# Patient Record
Sex: Female | Born: 1995 | Race: White | Hispanic: No | Marital: Single
Health system: Southern US, Community
[De-identification: ages and names within clinical notes are randomized; demographics above are authoritative.]

## PROBLEM LIST (undated history)

## (undated) DIAGNOSIS — F341 Dysthymic disorder: Secondary | ICD-10-CM

## (undated) DIAGNOSIS — R197 Diarrhea, unspecified: Secondary | ICD-10-CM

## (undated) DIAGNOSIS — F329 Major depressive disorder, single episode, unspecified: Secondary | ICD-10-CM

## (undated) DIAGNOSIS — R109 Unspecified abdominal pain: Secondary | ICD-10-CM

## (undated) DIAGNOSIS — K589 Irritable bowel syndrome without diarrhea: Secondary | ICD-10-CM

## (undated) DIAGNOSIS — F32A Depression, unspecified: Secondary | ICD-10-CM

## (undated) HISTORY — DX: Unspecified abdominal pain: R10.9

## (undated) HISTORY — PX: TYMPANOSTOMY TUBE PLACEMENT: SHX32

## (undated) HISTORY — DX: Major depressive disorder, single episode, unspecified: F32.9

## (undated) HISTORY — DX: Diarrhea, unspecified: R19.7

## (undated) HISTORY — DX: Dysthymic disorder: F34.1

## (undated) HISTORY — DX: Irritable bowel syndrome, unspecified: K58.9

## (undated) HISTORY — DX: Depression, unspecified: F32.A

---
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
## 2017-03-11 ENCOUNTER — Encounter: Payer: Self-pay | Admitting: Emergency Medicine

## 2017-03-11 ENCOUNTER — Emergency Department: Payer: 59

## 2017-03-11 ENCOUNTER — Emergency Department
Admission: EM | Admit: 2017-03-11 | Discharge: 2017-03-11 | Disposition: A | Discharge status: Home / Self Care | Payer: 59 | Attending: Emergency Medicine | Admitting: Emergency Medicine

## 2017-03-11 DIAGNOSIS — F329 Major depressive disorder, single episode, unspecified: Secondary | ICD-10-CM | POA: Insufficient documentation

## 2017-03-11 DIAGNOSIS — R1031 Right lower quadrant pain: Secondary | ICD-10-CM | POA: Insufficient documentation

## 2017-03-11 DIAGNOSIS — Z7722 Contact with and (suspected) exposure to environmental tobacco smoke (acute) (chronic): Secondary | ICD-10-CM | POA: Insufficient documentation

## 2017-03-11 DIAGNOSIS — R109 Unspecified abdominal pain: Secondary | ICD-10-CM

## 2017-03-11 DIAGNOSIS — Z79899 Other long term (current) drug therapy: Secondary | ICD-10-CM | POA: Insufficient documentation

## 2017-03-11 DIAGNOSIS — R1013 Epigastric pain: Secondary | ICD-10-CM | POA: Diagnosis present

## 2017-03-11 LAB — COMPREHENSIVE METABOLIC PANEL
ALBUMIN: 4.3 g/dL (ref 3.5–5.0)
ALT: 27 U/L (ref 14–54)
ANION GAP: 7 (ref 5–15)
AST: 31 U/L (ref 15–41)
Alkaline Phosphatase: 41 U/L (ref 38–126)
BUN: 11 mg/dL (ref 6–20)
CO2: 25 mmol/L (ref 22–32)
Calcium: 9.7 mg/dL (ref 8.9–10.3)
Chloride: 107 mmol/L (ref 101–111)
Creatinine, Ser: 0.59 mg/dL (ref 0.44–1.00)
GFR calc Af Amer: >60 mL/min (ref >60)
GFR calc non Af Amer: >60 mL/min (ref >60)
Glucose, Bld: 94 mg/dL (ref 65–99)
POTASSIUM: 3.5 mmol/L (ref 3.5–5.1)
SODIUM: 139 mmol/L (ref 135–145)
TOTAL PROTEIN: 7.5 g/dL (ref 6.5–8.1)
Total Bilirubin: 0.8 mg/dL (ref 0.3–1.2)

## 2017-03-11 LAB — URINALYSIS, COMPLETE (UACMP) WITH MICROSCOPIC
BILIRUBIN URINE: NEGATIVE
Bacteria, UA: NONE SEEN
GLUCOSE, UA: NEGATIVE mg/dL
HGB URINE DIPSTICK: NEGATIVE
KETONES UR: 20 mg/dL — AB
NITRITE: NEGATIVE
PROTEIN: NEGATIVE mg/dL
Specific Gravity, Urine: 1.019 (ref 1.005–1.030)
pH: 6 (ref 5.0–8.0)

## 2017-03-11 LAB — CBC
HEMATOCRIT: 43.2 % (ref 35.0–47.0)
HEMOGLOBIN: 14.7 g/dL (ref 12.0–16.0)
MCH: 32.5 pg (ref 26.0–34.0)
MCHC: 34.1 g/dL (ref 32.0–36.0)
MCV: 95.2 fL (ref 80.0–100.0)
Platelets: 304 10*3/uL (ref 150–440)
RBC: 4.54 MIL/uL (ref 3.80–5.20)
RDW: 11.5 % (ref 11.5–14.5)
WBC: 7.8 10*3/uL (ref 3.6–11.0)

## 2017-03-11 LAB — POCT PREGNANCY, URINE: Preg Test, Ur: NEGATIVE

## 2017-03-11 LAB — HCG, QUANTITATIVE, PREGNANCY: hCG, Beta Chain, Quant, S: <1 m[IU]/mL (ref <5)

## 2017-03-11 LAB — LIPASE, BLOOD: LIPASE: 28 U/L (ref 11–51)

## 2017-03-11 MED ORDER — IBUPROFEN 600 MG PO TABS: 600.0000 mg | ORAL_TABLET | Freq: Three times a day (TID) | ORAL | 0 refills | Status: DC | PRN

## 2017-03-11 MED ORDER — ONDANSETRON HCL 4 MG PO TABS: 4.0000 mg | ORAL_TABLET | Freq: Every day | ORAL | 0 refills | Status: DC | PRN

## 2017-03-11 MED ORDER — IOPAMIDOL (ISOVUE-300) INJECTION 61%
75.0000 mL | Freq: Once | INTRAVENOUS | Status: AC | PRN
  Administered 2017-03-11: 75 mL via INTRAVENOUS
  Filled 2017-03-11: qty 75

## 2017-03-11 NOTE — ED Provider Notes (Signed)
[facility] Emergency Department Provider Note  ____________________________________________   First MD Initiated Contact with Patient 03/11/17 1302     (approximate)  I have reviewed the triage vital signs and the nursing notes.   HISTORY  Chief Complaint Abdominal Pain   HPI Peggy Olson is a 22 y.o. female who self presents the emergency department with diffuse moderate severity cramping epigastric pain that awoke her from sleep this morning around 5 in the morning.  She went back to sleep several times however it woke her from her sleep multiple times.  The pain is now migrated towards her right lower quadrant.  Her last menstrual period was 1 week ago.  She has no history of abdominal surgeries.  She denies nausea or vomiting.  She denies dysuria frequency or hesitancy.  She denies vaginal discharge.  Her symptoms have been constant.  Seem to be somewhat worse with movement somewhat improved with rest.  Past Medical History:  Diagnosis Date  . Abdominal pain   . Depression    no medications required  . Diarrhea   . Dysthymia   . IBS (irritable bowel syndrome)     Patient Active Problem List   Diagnosis Date Noted  . Menorrhagia with regular cycle 09/25/2016  . Dysmenorrhea 09/25/2016  . Dysthymia 07/13/2015  . Social anxiety disorder 07/13/2015  . Involuntary commitment 07/13/2015  . Suicidal ideation 07/13/2015  . Small intestinal bacterial overgrowth 07/20/2013  . Nausea alone 05/21/2013  . RUQ abdominal pain 05/21/2013  . Excessive gas 05/21/2013  . Waterbrash 05/21/2013  . Lower abdominal pain   . Diarrhea     Past Surgical History:  Procedure Laterality Date  . TYMPANOSTOMY TUBE PLACEMENT      Prior to Admission medications   Medication Sig Start Date End Date Taking? Authorizing Provider  ibuprofen (ADVIL,MOTRIN) 600 MG tablet Take 1 tablet (600 mg total) by mouth every 8 (eight) hours as needed. 03/11/17   Merrily Brittle, MD    Norethindrone Acetate-Ethinyl Estrad-FE (MICROGESTIN 24 FE) 1-20 MG-MCG(24) tablet Take 1 tablet by mouth daily. 09/25/16   Farrel Conners, CNM  ondansetron (ZOFRAN) 4 MG tablet Take 1 tablet (4 mg total) by mouth daily as needed. 03/11/17 03/11/18  Merrily Brittle, MD    Allergies Romilda Joy Donia Pounds fruticosus] and Kiwi extract  Family History  Problem Relation Age of Onset  . Lactose intolerance Mother   . Heart disease Maternal Grandfather   . Colon cancer Paternal Grandmother   . Irritable bowel syndrome Neg Hx   . Celiac disease Neg Hx   . Inflammatory bowel disease Neg Hx   . Cholelithiasis Neg Hx     Social History Social History   Tobacco Use  . Smoking status: Passive Smoke Exposure - Never Smoker  . Smokeless tobacco: Never Used  Substance Use Topics  . Alcohol use: No  . Drug use: No    Review of Systems Constitutional: No fever/chills Eyes: No visual changes. ENT: No sore throat. Cardiovascular: Denies chest pain. Respiratory: Denies shortness of breath. Gastrointestinal: Positive for abdominal pain.  No nausea, no vomiting.  No diarrhea.  No constipation. Genitourinary: Negative for dysuria. Musculoskeletal: Negative for back pain. Skin: Negative for rash. Neurological: Negative for headaches, focal weakness or numbness.   ____________________________________________   PHYSICAL EXAM:  VITAL SIGNS: ED Triage Vitals [03/11/17 1210]  Enc Vitals Group     BP 123/75     Pulse Rate 90     Resp 18  Temp 98.3 F (36.8 C)     Temp Source Oral     SpO2 100 %     Weight 90 lb (40.8 kg)     Height 5\' 1"  (1.549 m)     Head Circumference      Peak Flow      Pain Score 6     Pain Loc      Pain Edu?      Excl. in GC?     Constitutional: Alert and oriented x4 appears somewhat uncomfortable nontoxic no diaphoresis speaks in full clear sentences Eyes: PERRL EOMI. Head: Atraumatic. Nose: No congestion/rhinnorhea. Mouth/Throat: No trismus Neck: No  stridor.   Cardiovascular: Normal rate, regular rhythm. Grossly normal heart sounds.  Good peripheral circulation. Respiratory: Normal respiratory effort.  No retractions. Lungs CTAB and moving good air Gastrointestinal: Soft abdomen quite tender over McBurney's point also positive Rovsing's she has some rebound and guarding although no frank peritonitis Musculoskeletal: No lower extremity edema   Neurologic:  Normal speech and language. No gross focal neurologic deficits are appreciated. Skin:  Skin is warm, dry and intact. No rash noted. Psychiatric: Mood and affect are normal. Speech and behavior are normal.    ____________________________________________   DIFFERENTIAL includes but not limited to  Appendicitis, ovarian torsion, ovarian cyst, pelvic inflammatory disease, urinary tract infection, pyelonephritis ____________________________________________   LABS (all labs ordered are listed, but only abnormal results are displayed)  Labs Reviewed  URINALYSIS, COMPLETE (UACMP) WITH MICROSCOPIC - Abnormal; Notable for the following components:      Result Value   Color, Urine YELLOW (*)    APPearance CLOUDY (*)    Ketones, ur 20 (*)    Leukocytes, UA LARGE (*)    Squamous Epithelial / LPF 6-30 (*)    All other components within normal limits  LIPASE, BLOOD  COMPREHENSIVE METABOLIC PANEL  CBC  HCG, QUANTITATIVE, PREGNANCY  POC URINE PREG, ED  POCT PREGNANCY, URINE    Lab work reviewed by me not pregnant no acute disease __________________________________________  EKG   ____________________________________________  RADIOLOGY  CT abdomen pelvis reviewed by me with a small amount of free fluid and no obvious appendicitis although appendix not visualized ____________________________________________   PROCEDURES  Procedure(s) performed: no  Procedures  Critical Care performed: no  Observation: no ____________________________________________   INITIAL  IMPRESSION / ASSESSMENT AND PLAN / ED COURSE  Pertinent labs & imaging results that were available during my care of the patient were reviewed by me and considered in my medical decision making (see chart for details).  Patient arrives uncomfortable appearing with diffuse abdominal discomfort but quite tender over McBurney's.  CT abdomen pelvis is pending.     ----------------------------------------- 2:16 PM on 03/11/2017 -----------------------------------------  The patient's pain is resolved and she is able to eat and drink.  CT scan with no obvious etiology of her symptoms however a normal appendix is not visualized.  I discussed with the patient the diagnostic uncertainty and I offered her surgical consultation now versus outpatient management with a recheck tomorrow morning in our emergency department and she would prefer to go home which I think is reasonable. ____________________________________________   FINAL CLINICAL IMPRESSION(S) / ED DIAGNOSES  Final diagnoses:  Abdominal pain, unspecified abdominal location      NEW MEDICATIONS STARTED DURING THIS VISIT:  New Prescriptions   IBUPROFEN (ADVIL,MOTRIN) 600 MG TABLET    Take 1 tablet (600 mg total) by mouth every 8 (eight) hours as needed.   ONDANSETRON (ZOFRAN) 4  MG TABLET    Take 1 tablet (4 mg total) by mouth daily as needed.     Note:  This document was prepared using Dragon voice recognition software and may include unintentional dictation errors.     Merrily Brittle, MD 03/12/17 1236

## 2017-03-11 NOTE — ED Triage Notes (Addendum)
abd pain today , denies N/V/D, denies vaginal or urinary issues

## 2017-03-11 NOTE — ED Notes (Signed)
Patient to room 36 asked to put gown on.

## 2017-03-11 NOTE — ED Notes (Signed)
T-98.3 PO, P-80 radial, R-16, BP 122/91 left arm automatic.  Patient states she is experiencing abdominal pain from sternum to pelvis unchanged since triage.  Alert and oriented.  Color good. NAD.  [facility]

## 2017-03-11 NOTE — Discharge Instructions (Signed)
Today your blood work was reassuring and your CT scan did not show a clear appendicitis, however it is still entirely possible that you have early appendicitis that we were unable to see.  Please return to our emergency department tomorrow to have a doctor feel your stomach and reevaluate you.  Return to the ER sooner for any new or worsening symptoms such as fevers, chills, worsening pain, if you cannot eat or drink, or for any other issues whatsoever.  It was a pleasure to take care of you today, and thank you for coming to our emergency department.  If you have any questions or concerns before leaving please ask the nurse to grab me and I'm more than happy to go through your aftercare instructions again.  If you were prescribed any opioid pain medication today such as Norco, Vicodin, Percocet, morphine, hydrocodone, or oxycodone please make sure you do not drive when you are taking this medication as it can alter your ability to drive safely.  If you have any concerns once you are home that you are not improving or are in fact getting worse before you can make it to your follow-up appointment, please do not hesitate to call 911 and come back for further evaluation.  Merrily BrittleNeil Caden Fukushima, MD  Results for orders placed or performed during the hospital encounter of 03/11/17  Lipase, blood  Result Value Ref Range   Lipase 28 11 - 51 U/L  Comprehensive metabolic panel  Result Value Ref Range   Sodium 139 135 - 145 mmol/L   Potassium 3.5 3.5 - 5.1 mmol/L   Chloride 107 101 - 111 mmol/L   CO2 25 22 - 32 mmol/L   Glucose, Bld 94 65 - 99 mg/dL   BUN 11 6 - 20 mg/dL   Creatinine, Ser 1.610.59 0.44 - 1.00 mg/dL   Calcium 9.7 8.9 - 09.610.3 mg/dL   Total Protein 7.5 6.5 - 8.1 g/dL   Albumin 4.3 3.5 - 5.0 g/dL   AST 31 15 - 41 U/L   ALT 27 14 - 54 U/L   Alkaline Phosphatase 41 38 - 126 U/L   Total Bilirubin 0.8 0.3 - 1.2 mg/dL   GFR calc non Af Amer >60 >60 mL/min   GFR calc Af Amer >60 >60 mL/min   Anion  gap 7 5 - 15  CBC  Result Value Ref Range   WBC 7.8 3.6 - 11.0 K/uL   RBC 4.54 3.80 - 5.20 MIL/uL   Hemoglobin 14.7 12.0 - 16.0 g/dL   HCT 04.543.2 40.935.0 - 81.147.0 %   MCV 95.2 80.0 - 100.0 fL   MCH 32.5 26.0 - 34.0 pg   MCHC 34.1 32.0 - 36.0 g/dL   RDW 91.411.5 78.211.5 - 95.614.5 %   Platelets 304 150 - 440 K/uL  Urinalysis, Complete w Microscopic  Result Value Ref Range   Color, Urine YELLOW (A) YELLOW   APPearance CLOUDY (A) CLEAR   Specific Gravity, Urine 1.019 1.005 - 1.030   pH 6.0 5.0 - 8.0   Glucose, UA NEGATIVE NEGATIVE mg/dL   Hgb urine dipstick NEGATIVE NEGATIVE   Bilirubin Urine NEGATIVE NEGATIVE   Ketones, ur 20 (A) NEGATIVE mg/dL   Protein, ur NEGATIVE NEGATIVE mg/dL   Nitrite NEGATIVE NEGATIVE   Leukocytes, UA LARGE (A) NEGATIVE   RBC / HPF 6-30 0 - 5 RBC/hpf   WBC, UA TOO NUMEROUS TO COUNT 0 - 5 WBC/hpf   Bacteria, UA NONE SEEN NONE SEEN   Squamous Epithelial /  LPF 6-30 (A) NONE SEEN   Mucus PRESENT    Amorphous Crystal PRESENT   hCG, quantitative, pregnancy  Result Value Ref Range   hCG, Beta Chain, Quant, S <1 <5 mIU/mL  Pregnancy, urine POC  Result Value Ref Range   Preg Test, Ur NEGATIVE NEGATIVE   Ct Abdomen Pelvis W Contrast  Result Date: 03/11/2017 CLINICAL DATA:  Epigastric and umbilical pain for 9 hours, abdominal infection, normal white count and lipase, history irritable bowel syndrome EXAM: CT ABDOMEN AND PELVIS WITH CONTRAST TECHNIQUE: Multidetector CT imaging of the abdomen and pelvis was performed using the standard protocol following bolus administration of intravenous contrast. Sagittal and coronal MPR images reconstructed from axial data set. CONTRAST:  75mL ISOVUE-300 IOPAMIDOL (ISOVUE-300) INJECTION 61% IV. No oral contrast administered. COMPARISON:  None FINDINGS: Lower chest: Lung bases clear Hepatobiliary: Gallbladder and liver normal appearance Pancreas: Normal appearance Spleen: Normal appearance Adrenals/Urinary Tract: Adrenal glands, kidneys, ureters,  and bladder normal appearance Stomach/Bowel: Appendix not definitely visualized. No definite thickened appendix identified adjacent to the cecal tip. Stomach and bowel loops otherwise normal appearance. Vascular/Lymphatic: Unremarkable Reproductive: Uterus and ovaries grossly normal appearance Other: Small amount of nonspecific low-attenuation free pelvic fluid. Minimal stranding of small bowel mesentery in the pelvis, nonspecific. No free air or abscess. No hernia. Musculoskeletal: Unremarkable IMPRESSION: Small amount of nonspecific low-attenuation free pelvic fluid. Nonvisualization of the appendix limiting assessment for acute appendicitis though no definite acute pericecal process is identified. Remainder of exam unremarkable. Electronically Signed   By: Ulyses Southward M.D.   On: 03/11/2017 13:49

## 2017-03-12 ENCOUNTER — Emergency Department: Payer: 59

## 2017-03-12 ENCOUNTER — Encounter: Payer: Self-pay | Admitting: Emergency Medicine

## 2017-03-12 ENCOUNTER — Emergency Department
Admission: EM | Admit: 2017-03-12 | Discharge: 2017-03-12 | Disposition: A | Discharge status: Home / Self Care | Payer: 59 | Attending: Student in an Organized Health Care Education/Training Program | Admitting: Student in an Organized Health Care Education/Training Program

## 2017-03-12 DIAGNOSIS — Z7722 Contact with and (suspected) exposure to environmental tobacco smoke (acute) (chronic): Secondary | ICD-10-CM | POA: Diagnosis not present

## 2017-03-12 DIAGNOSIS — R1030 Lower abdominal pain, unspecified: Secondary | ICD-10-CM | POA: Insufficient documentation

## 2017-03-12 DIAGNOSIS — N760 Acute vaginitis: Secondary | ICD-10-CM | POA: Insufficient documentation

## 2017-03-12 DIAGNOSIS — R1084 Generalized abdominal pain: Secondary | ICD-10-CM | POA: Diagnosis present

## 2017-03-12 DIAGNOSIS — Z79899 Other long term (current) drug therapy: Secondary | ICD-10-CM | POA: Diagnosis not present

## 2017-03-12 DIAGNOSIS — B9689 Other specified bacterial agents as the cause of diseases classified elsewhere: Secondary | ICD-10-CM

## 2017-03-12 DIAGNOSIS — R102 Pelvic and perineal pain: Secondary | ICD-10-CM

## 2017-03-12 LAB — WET PREP, GENITAL
SPERM: NONE SEEN
Trich, Wet Prep: NONE SEEN
YEAST WET PREP: NONE SEEN

## 2017-03-12 LAB — URINALYSIS, COMPLETE (UACMP) WITH MICROSCOPIC
BACTERIA UA: NONE SEEN
Bilirubin Urine: NEGATIVE
Glucose, UA: NEGATIVE mg/dL
HGB URINE DIPSTICK: NEGATIVE
Ketones, ur: 20 mg/dL — AB
NITRITE: NEGATIVE
PROTEIN: NEGATIVE mg/dL
Specific Gravity, Urine: 1.026 (ref 1.005–1.030)
pH: 5 (ref 5.0–8.0)

## 2017-03-12 LAB — CBC
HCT: 42.5 % (ref 35.0–47.0)
HEMOGLOBIN: 14.3 g/dL (ref 12.0–16.0)
MCH: 32 pg (ref 26.0–34.0)
MCHC: 33.6 g/dL (ref 32.0–36.0)
MCV: 95.1 fL (ref 80.0–100.0)
Platelets: 268 10*3/uL (ref 150–440)
RBC: 4.47 MIL/uL (ref 3.80–5.20)
RDW: 11.8 % (ref 11.5–14.5)
WBC: 6.7 10*3/uL (ref 3.6–11.0)

## 2017-03-12 LAB — CHLAMYDIA/NGC RT PCR (ARMC ONLY)
CHLAMYDIA TR: NOT DETECTED
N gonorrhoeae: NOT DETECTED

## 2017-03-12 LAB — COMPREHENSIVE METABOLIC PANEL
ALBUMIN: 4.5 g/dL (ref 3.5–5.0)
ALT: 32 U/L (ref 14–54)
ANION GAP: 10 (ref 5–15)
AST: 29 U/L (ref 15–41)
Alkaline Phosphatase: 39 U/L (ref 38–126)
BUN: 13 mg/dL (ref 6–20)
CHLORIDE: 105 mmol/L (ref 101–111)
CO2: 23 mmol/L (ref 22–32)
Calcium: 9.1 mg/dL (ref 8.9–10.3)
Creatinine, Ser: 0.57 mg/dL (ref 0.44–1.00)
GFR calc Af Amer: >60 mL/min (ref >60)
GFR calc non Af Amer: >60 mL/min (ref >60)
GLUCOSE: 86 mg/dL (ref 65–99)
POTASSIUM: 3.9 mmol/L (ref 3.5–5.1)
SODIUM: 138 mmol/L (ref 135–145)
TOTAL PROTEIN: 7.2 g/dL (ref 6.5–8.1)
Total Bilirubin: 0.8 mg/dL (ref 0.3–1.2)

## 2017-03-12 LAB — LIPASE, BLOOD: LIPASE: 26 U/L (ref 11–51)

## 2017-03-12 MED ORDER — ONDANSETRON HCL 4 MG PO TABS: 4.0000 mg | ORAL_TABLET | Freq: Every day | ORAL | 0 refills | Status: DC | PRN

## 2017-03-12 MED ORDER — CEFTRIAXONE SODIUM 250 MG IJ SOLR
INTRAMUSCULAR | Status: AC
  Filled 2017-03-12: qty 250

## 2017-03-12 MED ORDER — AZITHROMYCIN 500 MG PO TABS
1000.0000 mg | ORAL_TABLET | Freq: Once | ORAL | Status: AC
  Administered 2017-03-12: 1000 mg via ORAL

## 2017-03-12 MED ORDER — AZITHROMYCIN 500 MG PO TABS
ORAL_TABLET | ORAL | Status: AC
  Filled 2017-03-12: qty 2

## 2017-03-12 MED ORDER — METRONIDAZOLE 500 MG PO TABS: 500.0000 mg | ORAL_TABLET | Freq: Two times a day (BID) | ORAL | 0 refills | Status: AC

## 2017-03-12 MED ORDER — CEFTRIAXONE SODIUM 250 MG IJ SOLR
250.0000 mg | Freq: Once | INTRAMUSCULAR | Status: AC
  Administered 2017-03-12: 250 mg via INTRAMUSCULAR

## 2017-03-12 NOTE — ED Triage Notes (Signed)
Lower abdominal pain, was seen here yesterday for the same, here with continued pain, constipation and unable to eat.

## 2017-03-12 NOTE — ED Notes (Signed)
Water provided for pt to fill bladder for [place]

## 2017-03-12 NOTE — ED Provider Notes (Signed)
[facility] Emergency Department Provider Note    None    (approximate)  I have reviewed the triage vital signs and the nursing notes.   HISTORY  Chief Complaint Abdominal Pain    HPI Peggy Olson is a 22 y.o. female presents 24hours after being evaluated in the ER for abdominal pain was initially generalized.  She had extensive workup including blood work and CT imaging was inconclusive.  Advised to return to the ER if pain persist.  States the pain has improved but she still having some lower abdominal pain.  No fevers.  She is tolerating oral hydration but does not have much of an appetite.  Has not moved her bowels.  States she does have some vaginal discharge but no bleeding.  Denies any chance of being pregnant.  States the pain is mild to moderate and crampy in nature.  Past Medical History:  Diagnosis Date  . Abdominal pain   . Depression    no medications required  . Diarrhea   . Dysthymia   . IBS (irritable bowel syndrome)    Family History  Problem Relation Age of Onset  . Lactose intolerance Mother   . Heart disease Maternal Grandfather   . Colon cancer Paternal Grandmother   . Irritable bowel syndrome Neg Hx   . Celiac disease Neg Hx   . Inflammatory bowel disease Neg Hx   . Cholelithiasis Neg Hx    Past Surgical History:  Procedure Laterality Date  . TYMPANOSTOMY TUBE PLACEMENT     Patient Active Problem List   Diagnosis Date Noted  . Menorrhagia with regular cycle 09/25/2016  . Dysmenorrhea 09/25/2016  . Dysthymia 07/13/2015  . Social anxiety disorder 07/13/2015  . Involuntary commitment 07/13/2015  . Suicidal ideation 07/13/2015  . Small intestinal bacterial overgrowth 07/20/2013  . Nausea alone 05/21/2013  . RUQ abdominal pain 05/21/2013  . Excessive gas 05/21/2013  . Waterbrash 05/21/2013  . Lower abdominal pain   . Diarrhea       Prior to Admission medications   Medication Sig Start Date End Date Taking?  Authorizing Provider  ibuprofen (ADVIL,MOTRIN) 600 MG tablet Take 1 tablet (600 mg total) by mouth every 8 (eight) hours as needed. 03/11/17   Merrily Brittleifenbark, Neil, MD  Norethindrone Acetate-Ethinyl Estrad-FE (MICROGESTIN 24 FE) 1-20 MG-MCG(24) tablet Take 1 tablet by mouth daily. 09/25/16   Farrel ConnersGutierrez, Colleen, CNM  ondansetron (ZOFRAN) 4 MG tablet Take 1 tablet (4 mg total) by mouth daily as needed. 03/11/17 03/11/18  Merrily Brittleifenbark, Neil, MD    Allergies Romilda JoyBlackberry Donia Pounds[rubus fruticosus] and Kiwi extract    Social History Social History   Tobacco Use  . Smoking status: Passive Smoke Exposure - Never Smoker  . Smokeless tobacco: Never Used  Substance Use Topics  . Alcohol use: No  . Drug use: No    Review of Systems Patient denies headaches, rhinorrhea, blurry vision, numbness, shortness of breath, chest pain, edema, cough, abdominal pain, nausea, vomiting, diarrhea, dysuria, fevers, rashes or hallucinations unless otherwise stated above in HPI. ____________________________________________   PHYSICAL EXAM:  VITAL SIGNS: Vitals:   03/12/17 1058  BP: 124/75  Pulse: 86  Resp: 18  Temp: (!) 97.5 F (36.4 C)  SpO2: 100%    Constitutional: Alert and oriented. Well appearing and in no acute distress. Eyes: Conjunctivae are normal.  Head: Atraumatic. Nose: No congestion/rhinnorhea. Mouth/Throat: Mucous membranes are moist.   Neck: No stridor. Painless ROM.  Cardiovascular: Normal rate, regular rhythm. Grossly normal heart sounds.  Good peripheral circulation. Respiratory: Normal respiratory effort.  No retractions. Lungs CTAB. Gastrointestinal: Soft and nontender. No distention. No abdominal bruits. No CVA tenderness. Genitourinary: Cervix is tender with some purulent discharge.  No adnexal tenderness.  Does have some erythema at the cervical loss. Musculoskeletal: No lower extremity tenderness nor edema.  No joint effusions. Neurologic:  Normal speech and language. No gross focal neurologic  deficits are appreciated. No facial droop Skin:  Skin is warm, dry and intact. No rash noted. Psychiatric: Mood and affect are normal. Speech and behavior are normal.  ____________________________________________   LABS (all labs ordered are listed, but only abnormal results are displayed)  Results for orders placed or performed during the hospital encounter of 03/12/17 (from the past 24 hour(s))  Lipase, blood     Status: None   Collection Time: 03/12/17 11:04 AM  Result Value Ref Range   Lipase 26 11 - 51 U/L  Comprehensive metabolic panel     Status: None   Collection Time: 03/12/17 11:04 AM  Result Value Ref Range   Sodium 138 135 - 145 mmol/L   Potassium 3.9 3.5 - 5.1 mmol/L   Chloride 105 101 - 111 mmol/L   CO2 23 22 - 32 mmol/L   Glucose, Bld 86 65 - 99 mg/dL   BUN 13 6 - 20 mg/dL   Creatinine, Ser 1.61 0.44 - 1.00 mg/dL   Calcium 9.1 8.9 - 09.6 mg/dL   Total Protein 7.2 6.5 - 8.1 g/dL   Albumin 4.5 3.5 - 5.0 g/dL   AST 29 15 - 41 U/L   ALT 32 14 - 54 U/L   Alkaline Phosphatase 39 38 - 126 U/L   Total Bilirubin 0.8 0.3 - 1.2 mg/dL   GFR calc non Af Amer >60 >60 mL/min   GFR calc Af Amer >60 >60 mL/min   Anion gap 10 5 - 15  CBC     Status: None   Collection Time: 03/12/17 11:04 AM  Result Value Ref Range   WBC 6.7 3.6 - 11.0 K/uL   RBC 4.47 3.80 - 5.20 MIL/uL   Hemoglobin 14.3 12.0 - 16.0 g/dL   HCT 04.5 40.9 - 81.1 %   MCV 95.1 80.0 - 100.0 fL   MCH 32.0 26.0 - 34.0 pg   MCHC 33.6 32.0 - 36.0 g/dL   RDW 91.4 78.2 - 95.6 %   Platelets 268 150 - 440 K/uL   ____________________________________________  ____________________________________________  RADIOLOGY  I personally reviewed all radiographic images ordered to evaluate for the above acute complaints and reviewed radiology reports and findings.  These findings were personally discussed with the patient.  Please see medical record for radiology  report.  ____________________________________________   PROCEDURES  Procedure(s) performed:  Procedures    Critical Care performed: no ____________________________________________   INITIAL IMPRESSION / ASSESSMENT AND PLAN / ED COURSE  Pertinent labs & imaging results that were available during my care of the patient were reviewed by me and considered in my medical decision making (see chart for details).  DDX: pid, torsion, toa, ibs, appy, ibd  Peggy Olson is a 22 y.o. who presents to the ED with abdominal and lower abdominal pain as described above.  Blood work repeated today and is reassuring.  She remains afebrile Heema dynamically stable.  Pelvic exam as above.  Ultrasound ordered to evaluate for evidence of TOA torsion or cyst.  Ultrasound is reassuring.  Did test positive for BV on wet prep.  Based on her  risk factors and pelvic pain concerning for PID will treat.  Most clinically consistent with bacterial vaginosis.  Patient stable and appropriate for outpatient follow-up.      ____________________________________________   FINAL CLINICAL IMPRESSION(S) / ED DIAGNOSES  Final diagnoses:  Lower abdominal pain  Bacterial vaginosis      NEW MEDICATIONS STARTED DURING THIS VISIT:  New Prescriptions   No medications on file     Note:  This document was prepared using Dragon voice recognition software and may include unintentional dictation errors.    Willy Eddy, MD 03/12/17 1430

## 2017-03-12 NOTE — Discharge Instructions (Signed)

## 2017-03-28 ENCOUNTER — Encounter: Payer: Self-pay | Admitting: Certified Nurse Midwife

## 2017-03-28 ENCOUNTER — Ambulatory Visit (INDEPENDENT_AMBULATORY_CARE_PROVIDER_SITE_OTHER): Payer: 59 | Admitting: Certified Nurse Midwife

## 2017-03-28 VITALS — BP 98/58 | HR 78 | Ht 61.0 in | Wt 98.0 lb

## 2017-03-28 DIAGNOSIS — Z3041 Encounter for surveillance of contraceptive pills: Secondary | ICD-10-CM

## 2017-03-28 DIAGNOSIS — N946 Dysmenorrhea, unspecified: Secondary | ICD-10-CM | POA: Diagnosis not present

## 2017-03-28 DIAGNOSIS — N920 Excessive and frequent menstruation with regular cycle: Secondary | ICD-10-CM

## 2017-03-28 MED ORDER — NORETHIN ACE-ETH ESTRAD-FE 1-20 MG-MCG(24) PO TABS: 1.0000 | ORAL_TABLET | Freq: Every day | ORAL | 6 refills | Status: DC

## 2017-03-28 NOTE — Progress Notes (Signed)
  History of Present Illness:  Peggy Olson is a 22 y.o. who was started on Microgestin 24 approximately 8 months ago. Since that time, she states that her bleeding is not as heavy. Still having some cramping with her withdrawal bleeding, but it is not as intense. Has not had a problem with BTB until this month, when she stopped taking the pill for a few days because she thought it would interact with the Flagyl she was prescribed for bacterial vaginosis. The bleeding slowed when she restarted taking the pill, but she continued to bleed until she was suppose to have her withdrawal bleed. LMP 03/23/2017 She has become sexually active since her last visit in August. Due for her annual in May. Had a negative GC and Chlamydia test earlier this month.  PMHx: She  has a past medical history of Abdominal pain, Depression, Diarrhea, Dysthymia, and IBS (irritable bowel syndrome). Also,  has a past surgical history that includes Tympanostomy tube placement., family history includes Colon cancer in her paternal grandmother; Heart disease in her maternal grandfather; Lactose intolerance in her mother.,  reports that she is a non-smoker but has been exposed to tobacco smoke. she has never used smokeless tobacco. She reports that she does not drink alcohol or use drugs.  She has a current medication list which includes the following prescription(s): norethindrone acetate-ethinyl estrad-fe, ibuprofen, and ondansetron. Also, is allergic to blackberry [rubus fruticosus] and kiwi extract.  ROS  Physical Exam:  BP (!) 98/58   Pulse 78   Ht 5\' 1"  (1.549 m)   Wt 98 lb (44.5 kg)   LMP 03/23/2017 (Exact Date)   BMI 18.52 kg/m  Body mass index is 18.52 kg/m. Constitutional: Well nourished, well developed female in no acute distress.   Neuro: Grossly intact Psych:  Normal mood and affect.    Assessment: Medication treatment is going well for her dysmenorrhea and menorrhagia.  Plan: She will undergo no change in  her medical therapy. Refills of Microgestin 24 sent to pharmacy. Reminded that she needs to use condoms as back up if she misses pills or is late starting a new pack of pills until she has been back on the pills x 1 week.  She was amenable to this plan and we will see her back for annual on MAy 1  Annamarie MajorPaul Harris, MD, Merlinda FrederickFACOG [facility], [facility] 03/28/2017  10:34 AM

## 2017-05-04 ENCOUNTER — Other Ambulatory Visit: Payer: Self-pay | Admitting: Certified Nurse Midwife

## 2017-06-01 ENCOUNTER — Other Ambulatory Visit: Payer: Self-pay | Admitting: Certified Nurse Midwife

## 2017-06-05 ENCOUNTER — Ambulatory Visit: Payer: 59 | Admitting: Certified Nurse Midwife

## 2017-06-18 ENCOUNTER — Ambulatory Visit: Payer: 59 | Admitting: Certified Nurse Midwife

## 2017-07-18 ENCOUNTER — Ambulatory Visit (INDEPENDENT_AMBULATORY_CARE_PROVIDER_SITE_OTHER): Payer: 59 | Admitting: Certified Nurse Midwife

## 2017-07-18 ENCOUNTER — Encounter: Payer: Self-pay | Admitting: Certified Nurse Midwife

## 2017-07-18 VITALS — BP 100/66 | HR 89 | Ht 61.0 in | Wt 98.0 lb

## 2017-07-18 DIAGNOSIS — R300 Dysuria: Secondary | ICD-10-CM | POA: Diagnosis not present

## 2017-07-18 DIAGNOSIS — N939 Abnormal uterine and vaginal bleeding, unspecified: Secondary | ICD-10-CM

## 2017-07-18 DIAGNOSIS — Z113 Encounter for screening for infections with a predominantly sexual mode of transmission: Secondary | ICD-10-CM | POA: Diagnosis not present

## 2017-07-18 DIAGNOSIS — N9489 Other specified conditions associated with female genital organs and menstrual cycle: Secondary | ICD-10-CM

## 2017-07-18 LAB — POCT WET PREP (WET MOUNT): TRICHOMONAS WET PREP HPF POC: ABSENT

## 2017-07-18 LAB — POCT URINE PREGNANCY: Preg Test, Ur: NEGATIVE

## 2017-07-18 LAB — POCT URINALYSIS DIPSTICK
BILIRUBIN UA: NEGATIVE
Glucose, UA: NEGATIVE
KETONES UA: NEGATIVE
NITRITE UA: NEGATIVE
PH UA: 7 (ref 5.0–8.0)
PROTEIN UA: POSITIVE — AB
RBC UA: NEGATIVE
Spec Grav, UA: 1.01 (ref 1.010–1.025)
UROBILINOGEN UA: 0.2 U/dL

## 2017-07-18 MED ORDER — NORETHIN ACE-ETH ESTRAD-FE 1-20 MG-MCG(24) PO TABS: 1.0000 | ORAL_TABLET | Freq: Every day | ORAL | 11 refills | Status: DC

## 2017-07-18 NOTE — Progress Notes (Addendum)
Gynecology Annual Exam  PCP: Mickie BailSator Nogo, Jasna, MD  Chief Complaint:  Chief Complaint  Patient presents with  . Gynecologic Exam    History of Present Illness: Peggy Olson is a 22 y.o. G0P0000 who presents for annual exam. At the time of her last annual exam 06/01/2016, she was started on OCPs (Microgestin 24) to treat her long, sometimes heavy and painful menses. Her menses come every month and now last 3 days and are with a moderate flow. She has cramping that starts 1-2 days before her menses and lasts through her menses. She does not take any analgesics. She did have some break through bleeding last month and early in her current pack of pills. Denies missing pills. She has also noticed a thicker discharge since her last menses, but has had no vulvar itching. Has been experiencing some vulvar burning after intercourse, that worsens when urine runs over the skin. She had tried [employer] for a while, but is back to using a unscented soap to wash with. She is not aware of any changes in detergents.   She is sexually active with TJ and uses OCPs for contraception. Her past medical history is significant for dysthymia  and she was hospitalized in June 2017 for anxiety and depression. She is not currently seeing a counselor but feels like she "is in a good spot." She also has a history of IBS. She states the IBS is worse around the time of her menses. She has no family history of breast or ovarian cancer. She does not do self breast exams. She does not smoke or drink alcohol. 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 Peggy Olson does not exercise, but leads a active lifestyle She may not get adequate calcium in her diet. She does use Tums occasionally for heartburn.    Review of Systems: Review of Systems  Constitutional: Negative for chills, fever and weight loss.       Positive for weight gain of 5#  HENT: Negative for congestion, sinus pain and sore throat.   Eyes: Negative for blurred vision  and pain.  Respiratory: Negative for hemoptysis, shortness of breath and wheezing.   Cardiovascular: Negative for chest pain, palpitations and leg swelling.  Gastrointestinal: Positive for constipation, diarrhea and heartburn. Negative for abdominal pain, blood in stool, nausea and vomiting.  Genitourinary: Negative for dysuria, frequency, hematuria and urgency.       Positive for dysmenorrhea, vulvar irritation and burning and vaginal discharge  Musculoskeletal: Negative for back pain, joint pain and myalgias.  Skin: Negative for itching and rash.  Neurological: Negative for dizziness, tingling and headaches.  Endo/Heme/Allergies: Negative for environmental allergies and polydipsia. Does not bruise/bleed easily.       Negative for hirsutism   Psychiatric/Behavioral: Positive for depression. The patient is nervous/anxious and has insomnia.     Past Medical History:  Past Medical History:  Diagnosis Date  . Abdominal pain   . Depression    no medications required  . Diarrhea   . Dysthymia   . IBS (irritable bowel syndrome)     Past Surgical History:  Past Surgical History:  Procedure Laterality Date  . TYMPANOSTOMY TUBE PLACEMENT      Family History:  Family History  Problem Relation Age of Onset  . Lactose intolerance Mother   . Heart disease Maternal Grandfather   . Colon cancer Paternal Grandmother   . Irritable bowel syndrome Neg Hx   . Celiac disease Neg Hx   .  Inflammatory bowel disease Neg Hx   . Cholelithiasis Neg Hx     Social History:  Social History   Socioeconomic History  . Marital status: Single    Spouse name: Not on file  . Number of children: Not on file  . Years of education: Not on file  . Highest education level: Not on file  Occupational History  . Occupation: Producer, television/film/video  . Financial resource strain: Not on file  . Food insecurity:    Worry: Not on file    Inability: Not on file  . Transportation needs:    Medical: Not on  file    Non-medical: Not on file  Tobacco Use  . Smoking status: Passive Smoke Exposure - Never Smoker  . Smokeless tobacco: Never Used  Substance and Sexual Activity  . Alcohol use: No  . Drug use: No  . Sexual activity: Yes    Birth control/protection: Pill  Lifestyle  . Physical activity:    Days per week: 0 days    Minutes per session: 0 min  . Stress: To some extent  Relationships  . Social connections:    Talks on phone: Not on file    Gets together: Not on file    Attends religious service: Not on file    Active member of club or organization: Not on file    Attends meetings of clubs or organizations: Not on file    Relationship status: Not on file  . Intimate partner violence:    Fear of current or ex partner: Not on file    Emotionally abused: Not on file    Physically abused: Not on file    Forced sexual activity: Not on file  Other Topics Concern  . Not on file  Social History Narrative   10th grade 2014-2015    Allergies:  Allergies  Allergen Reactions  . Blackberry [Rubus Fruticosus]   . Kiwi Extract     Medications: Junel 24  Physical Exam Vitals: BP 100/66   Pulse 89   Ht 5\' 1"  (1.549 m)   Wt 44.5 kg (98 lb)   LMP 06/29/2017 (Exact Date)   BMI 18.52 kg/m   General: petite WF in NAD HEENT: normocephalic, anicteric, multiple piercings on nose, lower lip and ears Thyroid: no enlargement, no palpable nodules Pulmonary: No increased work of breathing, CTAB Cardiovascular: RRR without murmur Breast: Breast symmetrical, no tenderness, no palpable nodules or masses, no skin or nipple retraction present, no nipple discharge.  No axillary, infraclavicular, or supraclavicular lymphadenopathy. Abdomen:  soft, non-tender, non-distended.  Umbilicus without lesions.  No hepatomegaly, splenomegaly or masses palpable. No evidence of hernia  Genitourinary:  External: deeper pink labia minora and vestibule.  Normal urethral meatus, normal Bartholin's and Skene's  glands.    Vagina: Normal vaginal mucosa, white to clear mucoepithelial discharge  Cervix: no bleeding, NT. One digit pelvic exam  Uterus: AF, NSSC, mobile, NT  Adnexa:  no adnexal masses, NT  Rectal: deferred  Lymphatic: no evidence of inguinal lymphadenopathy Extremities: no edema, erythema, or tenderness Neurologic: Grossly intact Psychiatric: mood appropriate, affect full  Results for orders placed or performed in visit on 07/18/17 (from the past 24 hour(s))  POCT urine pregnancy     Status: Normal   Collection Time: 07/18/17  2:12 PM  Result Value Ref Range   Preg Test, Ur Negative Negative  POCT Urinalysis Dipstick     Status: Abnormal   Collection Time: 07/18/17  2:27 PM  Result Value  Ref Range   Color, UA dark yellow    Clarity, UA clear    Glucose, UA Negative Negative   Bilirubin, UA negative    Ketones, UA negative    Spec Grav, UA 1.010 1.010 - 1.025   Blood, UA negative    pH, UA 7.0 5.0 - 8.0   Protein, UA Positive (A) Negative   Urobilinogen, UA 0.2 0.2 or 1.0 E.U./dL   Nitrite, UA negative    Leukocytes, UA Moderate (2+) (A) Negative   Appearance     Odor    POCT Wet Prep [place])     Status: Normal   Collection Time: 07/18/17  5:42 PM  Result Value Ref Range   Source Wet Prep POC vagina    WBC, Wet Prep HPF POC     Bacteria Wet Prep HPF POC  Few   BACTERIA WET PREP MORPHOLOGY POC     Clue Cells Wet Prep HPF POC None None   Clue Cells Wet Prep Whiff POC     Yeast Wet Prep HPF POC None None   KOH Wet Prep POC  None   Trichomonas Wet Prep HPF POC Absent Absent      Assessment/Plan: 22 y.o. G0P0000 annual gyn exam Break through bleeding/spotting x 2-UPT negative  Offered changing pills, adding estrogen, or continuing to observe and patient desires to continue to monitor to see if                  BTB persists.Refill of Microgestin 24 #28/RF x 11 sent to pharmacy Vulvar burning after intercourse  Negative wet prep today  Chlamydia/GC NAAt  today  Avoid fragrances in soaps-there may be some vulvitis  Some dysuria-R/O UTI-urine culture sent   Start Pap smears next year   Farrel Conners, [place]

## 2017-07-20 LAB — CHLAMYDIA/GONOCOCCUS/TRICHOMONAS, NAA
CHLAMYDIA BY NAA: NEGATIVE
Gonococcus by NAA: NEGATIVE
Trich vag by NAA: NEGATIVE

## 2017-07-20 LAB — URINE CULTURE

## 2017-07-26 ENCOUNTER — Encounter: Payer: Self-pay | Admitting: Obstetrics and Gynecology

## 2018-07-29 ENCOUNTER — Other Ambulatory Visit: Payer: Self-pay | Admitting: Certified Nurse Midwife

## 2018-07-29 MED ORDER — NORETHIN ACE-ETH ESTRAD-FE 1-20 MG-MCG(24) PO TABS: 1.0000 | ORAL_TABLET | Freq: Every day | ORAL | 2 refills | Status: AC

## 2018-09-01 ENCOUNTER — Telehealth: Payer: Self-pay | Admitting: Certified Nurse Midwife

## 2018-09-01 NOTE — Telephone Encounter (Signed)
Tried to reach patient several times, MB full, unable to reach to get scheduled for annual.

## 2018-09-01 NOTE — Telephone Encounter (Signed)
-----   Message from Dalia Heading, [place] sent at 07/29/2018  9:40 AM EDT ----- Regarding: schedule appointment Hi Almyra Free, I just approved refills on this patient's pills. Please schedule her for an annual in the next 3 months/ Thanks, [place]

## 2018-11-04 ENCOUNTER — Other Ambulatory Visit: Payer: Self-pay | Admitting: Certified Nurse Midwife

## 2018-12-27 ENCOUNTER — Other Ambulatory Visit: Payer: Self-pay

## 2018-12-27 DIAGNOSIS — Z20822 Contact with and (suspected) exposure to covid-19: Secondary | ICD-10-CM

## 2018-12-29 LAB — NOVEL CORONAVIRUS, NAA: SARS-CoV-2, NAA: DETECTED — AB

## 2018-12-31 ENCOUNTER — Telehealth: Payer: Self-pay | Admitting: Internal Medicine

## 2018-12-31 NOTE — Telephone Encounter (Signed)
Unable to reach patient on both phone numbers on record. Voicemail is not set up. Will try again later.  Angelica Chessman, MD

## 2019-07-10 ENCOUNTER — Other Ambulatory Visit: Payer: Self-pay

## 2019-07-10 ENCOUNTER — Emergency Department
Admission: EM | Admit: 2019-07-10 | Discharge: 2019-07-10 | Disposition: A | Discharge status: Home / Self Care | Payer: Self-pay | Attending: Emergency Medicine | Admitting: Emergency Medicine

## 2019-07-10 DIAGNOSIS — L551 Sunburn of second degree: Secondary | ICD-10-CM | POA: Insufficient documentation

## 2019-07-10 NOTE — Discharge Instructions (Signed)
Apply ice 10 to 15 minutes at a time for every hour sitting. Tylenol and ibuprofen alternating can be taken for pain.

## 2019-07-10 NOTE — ED Provider Notes (Signed)
Emergency Department Provider Note  ____________________________________________  Time seen: Approximately 6:06 PM  I have reviewed the triage vital signs and the nursing notes.   HISTORY  Chief Complaint Sunburn   Historian Patient     HPI Peggy Olson is a 24 y.o. female presents to the emergency department with concern for sunburn.  Patient states that last Saturday, she went to the beach and was outside from 10 AM to 7 PM.  Patient states that when she went inside, she noticed that the dorsal aspect of her feet and ankles were edematous, stinging and erythematous.  Patient states that she had blisters developed which have slowly resolved over the course of the week.  Patient states that she is still had some mild swelling and erythema and became concerned as her symptoms have not resolved.  She denies fever chills.  Patient has similar regions of erythema along the knees where patient did not apply sunscreen.  No other alleviating measures have been attempted.   Past Medical History:  Diagnosis Date  . Abdominal pain   . Depression    no medications required  . Diarrhea   . Dysthymia   . IBS (irritable bowel syndrome)      Immunizations up to date:  Yes.     Past Medical History:  Diagnosis Date  . Abdominal pain   . Depression    no medications required  . Diarrhea   . Dysthymia   . IBS (irritable bowel syndrome)     Patient Active Problem List   Diagnosis Date Noted  . Menorrhagia with regular cycle 09/25/2016  . Dysmenorrhea 09/25/2016  . Dysthymia 07/13/2015  . Social anxiety disorder 07/13/2015  . Involuntary commitment 07/13/2015  . Suicidal ideation 07/13/2015  . Small intestinal bacterial overgrowth 07/20/2013  . Nausea alone 05/21/2013  . RUQ abdominal pain 05/21/2013  . Excessive gas 05/21/2013  . Waterbrash 05/21/2013  . Lower abdominal pain   . Diarrhea     Past Surgical History:  Procedure Laterality Date  . TYMPANOSTOMY TUBE  PLACEMENT      Prior to Admission medications   Medication Sig Start Date End Date Taking? Authorizing Provider  Norethindrone Acetate-Ethinyl Estrad-FE (JUNEL FE 24) 1-20 MG-MCG(24) tablet Take 1 tablet by mouth daily. 07/29/18   Dalia Heading, CNM    Allergies Blackberry [rubus fruticosus] and Kiwi extract  Family History  Problem Relation Age of Onset  . Lactose intolerance Mother   . Heart disease Maternal Grandfather   . Colon cancer Paternal Grandmother   . Breast cancer Paternal Grandmother   . Pancreatic cancer Paternal Grandmother   . Irritable bowel syndrome Neg Hx   . Celiac disease Neg Hx   . Inflammatory bowel disease Neg Hx   . Cholelithiasis Neg Hx     Social History Social History   Tobacco Use  . Smoking status: Passive Smoke Exposure - Never Smoker  . Smokeless tobacco: Never Used  Substance Use Topics  . Alcohol use: No  . Drug use: No     Review of Systems  Constitutional: No fever/chills Eyes:  No discharge ENT: No upper respiratory complaints. Respiratory: no cough. No SOB/ use of accessory muscles to breath Gastrointestinal:   No nausea, no vomiting.  No diarrhea.  No constipation. Musculoskeletal: Negative for musculoskeletal pain. Skin: Patient has sunburn.    ____________________________________________   PHYSICAL EXAM:  VITAL SIGNS: ED Triage Vitals  Enc Vitals Group     BP 07/10/19 1554 108/70  Pulse Rate 07/10/19 1554 93     Resp 07/10/19 1554 18     Temp 07/10/19 1554 98.6 F (37 C)     Temp Source 07/10/19 1554 Oral     SpO2 07/10/19 1554 99 %     Weight 07/10/19 1556 100 lb (45.4 kg)     Height 07/10/19 1556 5\' 1"  (1.549 m)     Head Circumference --      Peak Flow --      Pain Score 07/10/19 1556 8     Pain Loc --      Pain Edu? --      Excl. in GC? --      Constitutional: Alert and oriented. Well appearing and in no acute distress. Eyes: Conjunctivae are normal. PERRL. EOMI. Head:  Atraumatic. Cardiovascular: Normal rate, regular rhythm. Normal S1 and S2.  Good peripheral circulation. Respiratory: Normal respiratory effort without tachypnea or retractions. Lungs CTAB. Good air entry to the bases with no decreased or absent breath sounds Gastrointestinal: Bowel sounds x 4 quadrants. Soft and nontender to palpation. No guarding or rigidity. No distention. Musculoskeletal: Full range of motion to all extremities. No obvious deformities noted Neurologic:  Normal for age. No gross focal neurologic deficits are appreciated.  Skin: Patient has erythema along the dorsal aspect of the bilateral feet.  Patient has ruptured bulla.  No streaking. Psychiatric: Mood and affect are normal for age. Speech and behavior are normal.   ____________________________________________   LABS (all labs ordered are listed, but only abnormal results are displayed)  Labs Reviewed - No data to display ____________________________________________  EKG   ____________________________________________  RADIOLOGY   No results found.  ____________________________________________    PROCEDURES  Procedure(s) performed:     Procedures     Medications - No data to display   ____________________________________________   INITIAL IMPRESSION / ASSESSMENT AND PLAN / ED COURSE  Pertinent labs & imaging results that were available during my care of the patient were reviewed by me and considered in my medical decision making (see chart for details).    Assessment and plan Sunburn 24 year old female presents to the emergency department with sunburn along the dorsal aspect of the bilateral feet.  Patient showed me pictures of progression of sunburn from time of initial injury.  Patient had significant edema along the dorsal aspects of the bilateral feet when sunburn started along with erythema on small blisters.  Sunburn appears to be progressively improving.  There is still erythema  along the dorsal aspects of the feet.  Bulla have ruptured but there is no streaking or expression of purulent exudate to suggest infection.  Patient has similar regions of erythema along the bilateral knees where sunscreen was not applied.  I recommended ice and anti-inflammatories for pain over the next several days.  I cautioned patient to return to the emergency department if erythema seems to be worsening.  Patient states that she was reassured as sunburn has seemed to improve over the past several days.  Return precautions were given to return with new or worsening symptoms.  ____________________________________________  FINAL CLINICAL IMPRESSION(S) / ED DIAGNOSES  Final diagnoses:  Sunburn of second degree      NEW MEDICATIONS STARTED DURING THIS VISIT:  ED Discharge Orders    None          This chart was dictated using voice recognition software/Dragon. Despite best efforts to proofread, errors can occur which can change the meaning. Any change was purely unintentional.  Pia Mau [place], Cordelia Poche 07/10/19 2016    Phineas Semen, MD 07/10/19 2018

## 2019-07-10 NOTE — ED Triage Notes (Signed)
Pt arrives via POV for reports of sunburn on bilateral feet. PT reports she went to the beach last Saturday and did wear sunscreen. Pt has red rash that blanches to bilateral feet, pt limping to triage room from lobby. NAD noted.

## 2019-07-10 NOTE — ED Notes (Signed)
See triage note: pt sunburned on both feet; states she did not reapply sunscreen. Pt has some blistering to both feet. Pt alert & oriented, nad noted, limping while ambulating to room.

## 2019-09-18 IMAGING — US US ART/VEN ABD/PELV/SCROTUM DOPPLER LTD
1 series · 14 of 25 positions shown · non-contrast
Comparison: CT from yesterday

CLINICAL DATA: Lower abdominal pain.  Evaluate for torsion.

EXAM:
TRANSABDOMINAL AND TRANSVAGINAL ULTRASOUND OF PELVIS
DOPPLER ULTRASOUND OF OVARIES
TECHNIQUE: Both transabdominal and transvaginal ultrasound examinations of the
pelvis were performed. Transabdominal technique was performed for
global imaging of the pelvis including uterus, ovaries, adnexal
regions, and pelvic cul-de-sac.
It was necessary to proceed with endovaginal exam following the
transabdominal exam to visualize the ovaries. Color and duplex
Doppler ultrasound was utilized to evaluate blood flow to the
ovaries.

[Series 1: us art/ven abd/pelv/scrotum doppler ltd · 0.15mm/px · 14 of 93 slices shown]
[im 1/93]
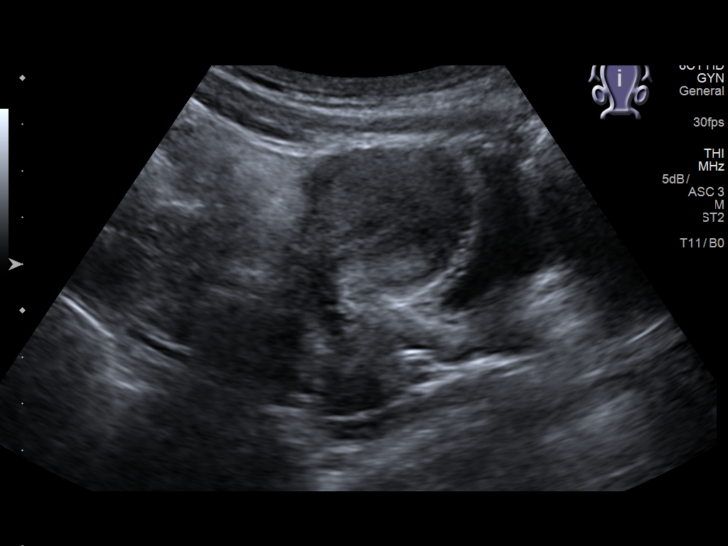
[im 8/93]
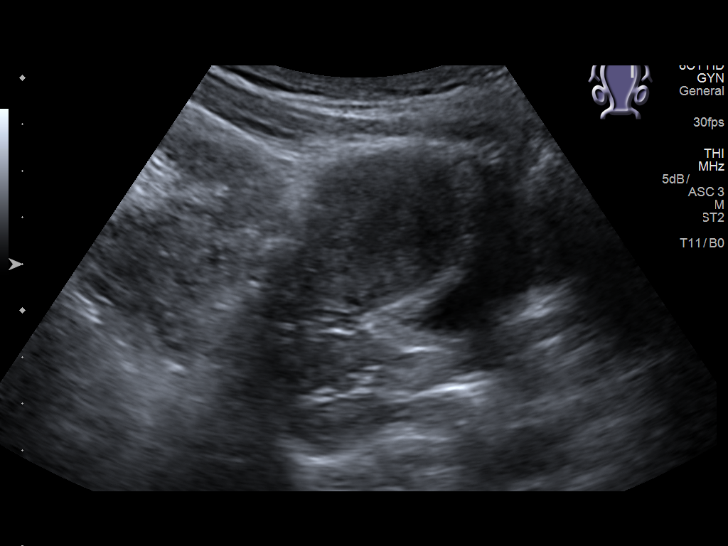
[im 16/93]
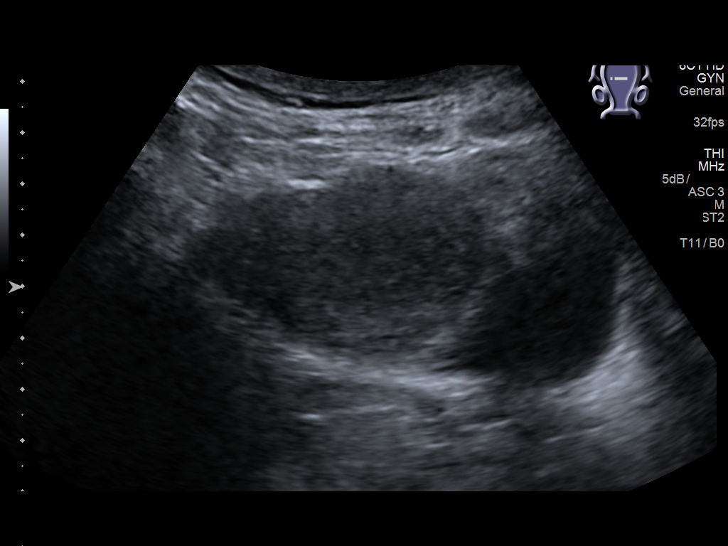
[im 24/93]
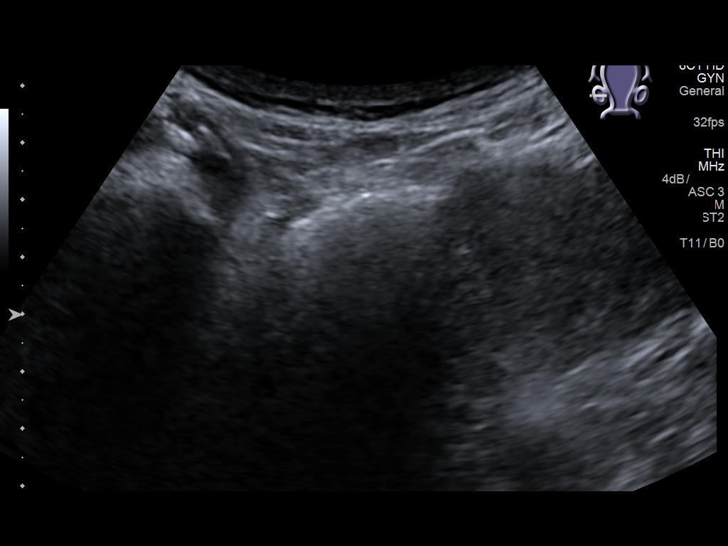
[im 31/93]
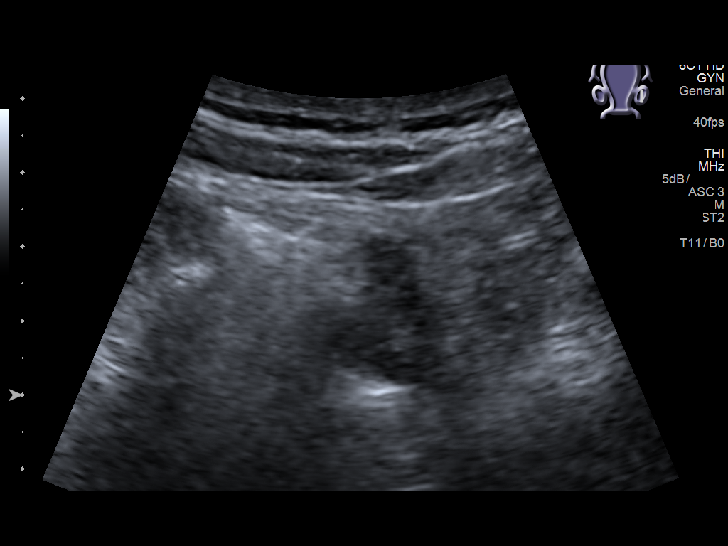
[im 35/93]
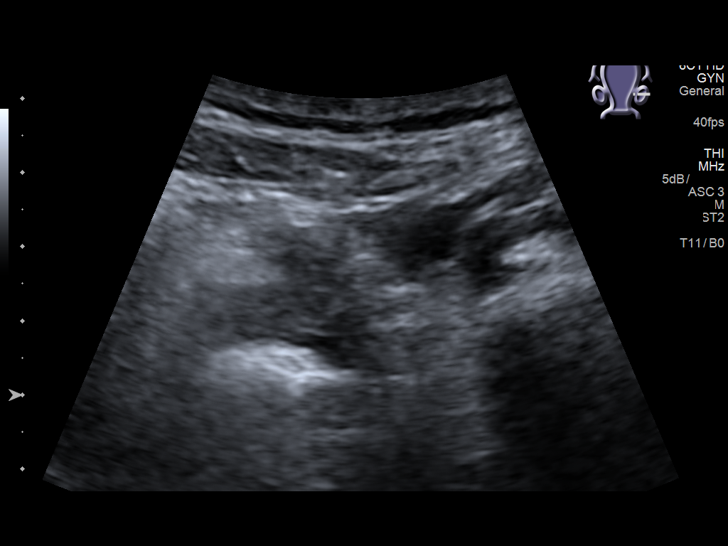
[im 43/93]
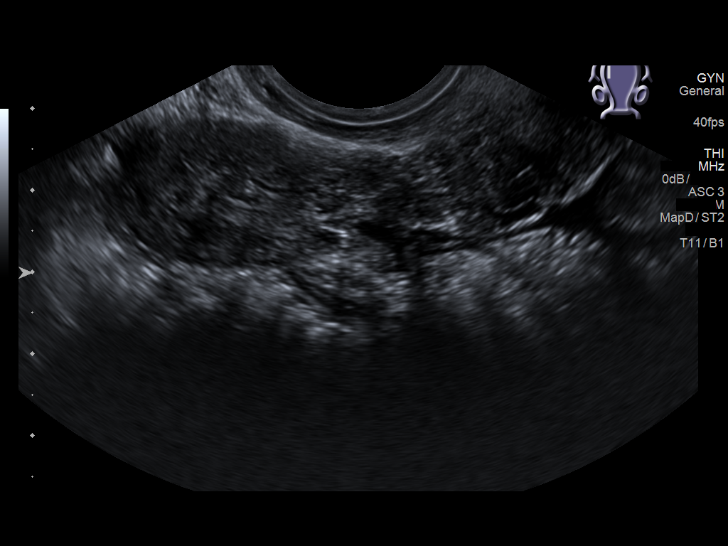
[im 50/93]
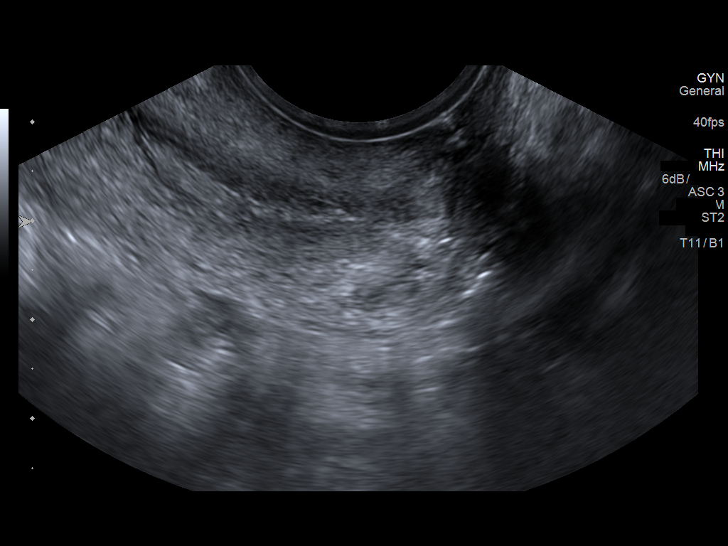
[im 58/93]
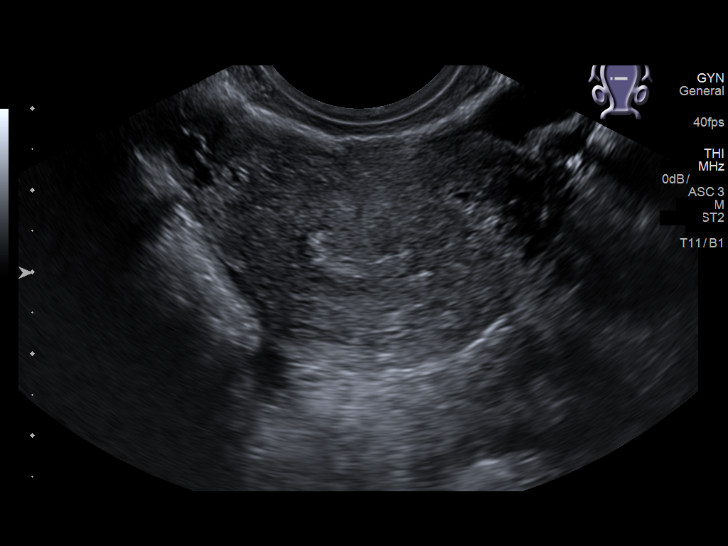
[im 62/93]
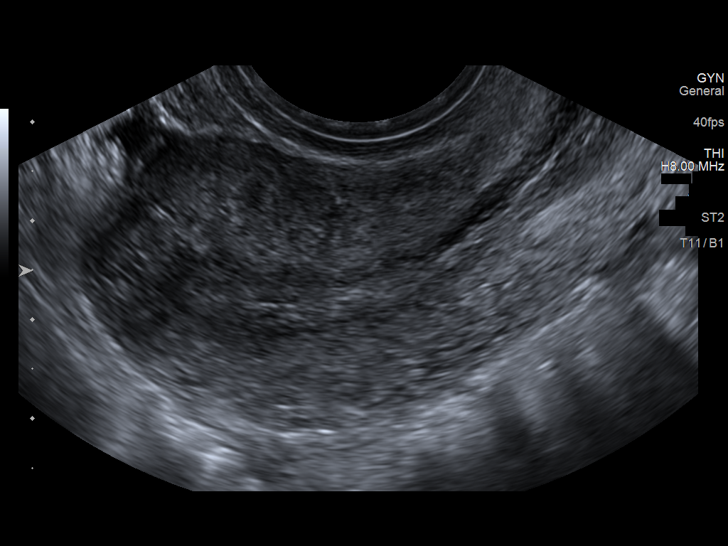
[im 70/93]
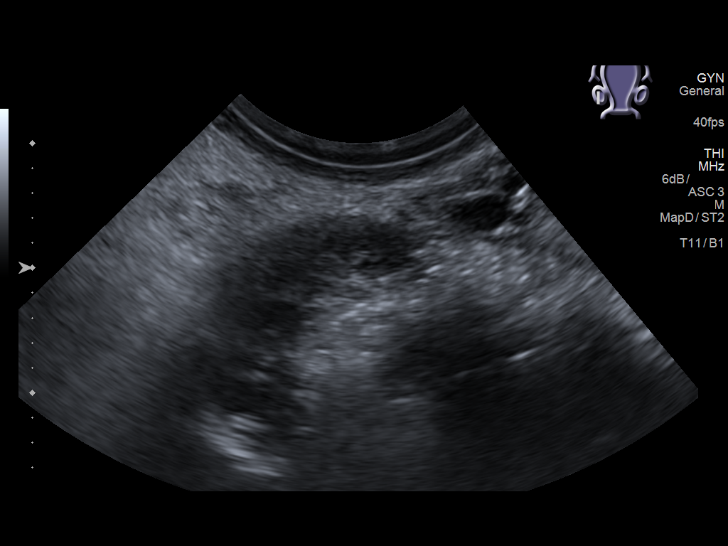
[im 77/93]
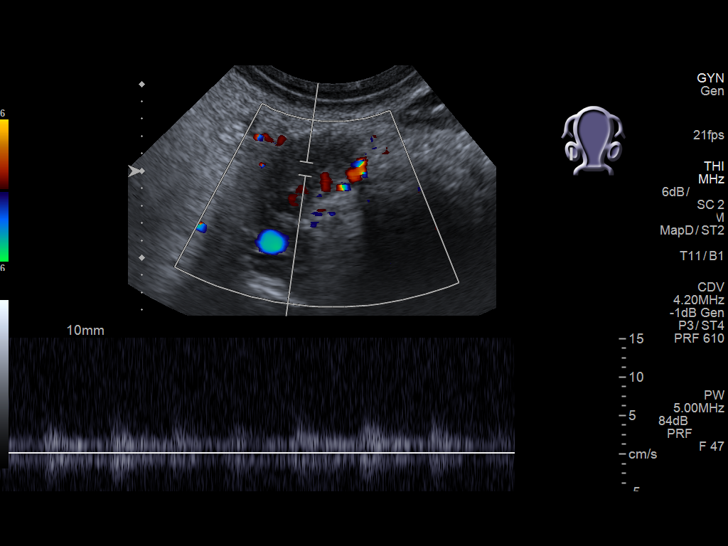
[im 85/93]
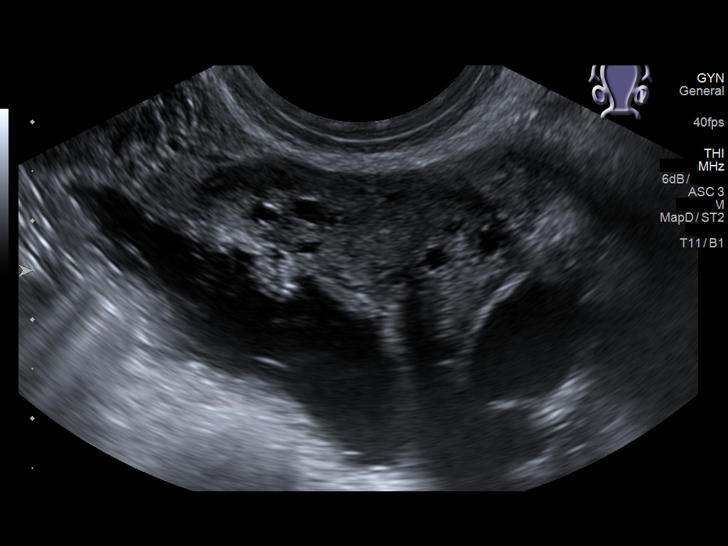
[im 93/93]
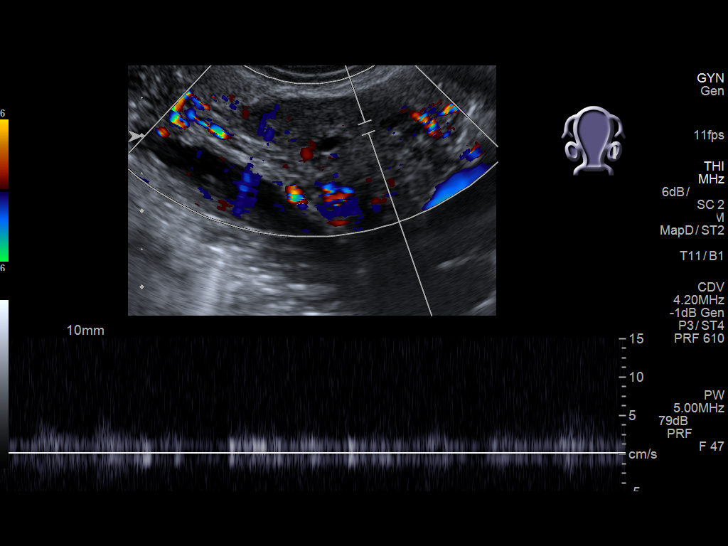

[14 of 25 positions shown; findings below may reference images not displayed]

FINDINGS: Uterus

Measurements: 6 x 3 x 4 cm.  No fibroids or other mass visualized.

Endometrium

Thickness: 6 mm.  No focal abnormality visualized.

Right ovary

Measurements: 19 x 10 x 9 mm.  Normal appearance/no adnexal mass.

Left ovary

Measurements: 33 x 15 x 19 mm.  Normal appearance/no adnexal mass.

Pulsed Doppler evaluation of both ovaries demonstrates normal
low-resistance arterial and venous waveforms.

Other findings

Trace simple pelvic fluid which can be physiologic.
IMPRESSION: Normal pelvic ultrasound.

## 2020-01-25 IMAGING — CT CT ABD-PELV W/ CM
2 of 4 series · 16 of 46 positions shown, 18 images · IV contrast (APPLIED)
Comparison: None

CLINICAL DATA: Epigastric and umbilical pain for 9 hours, abdominal
infection, normal white count and lipase, history irritable bowel
syndrome

EXAM:
CT ABDOMEN AND PELVIS WITH CONTRAST
TECHNIQUE: Multidetector CT imaging of the abdomen and pelvis was performed
using the standard protocol following bolus administration of
intravenous contrast. Sagittal and coronal MPR images reconstructed
from axial data set.
CONTRAST:  75mL MZIURH-W66 IOPAMIDOL (MZIURH-W66) INJECTION 61% IV.
No oral contrast administered.

[Series 2: routine abd/pel with · axial · 0.58mm/px · z∈[-988,-644]mm · 13 of 76 slices shown, 15 images]
[im 4/76  soft-tissue]
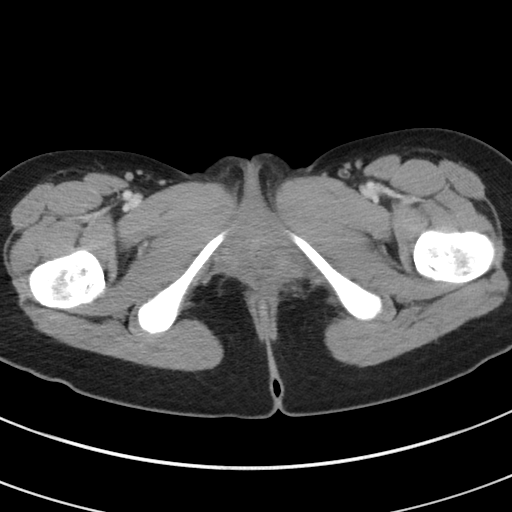
[im 4/76  bone]
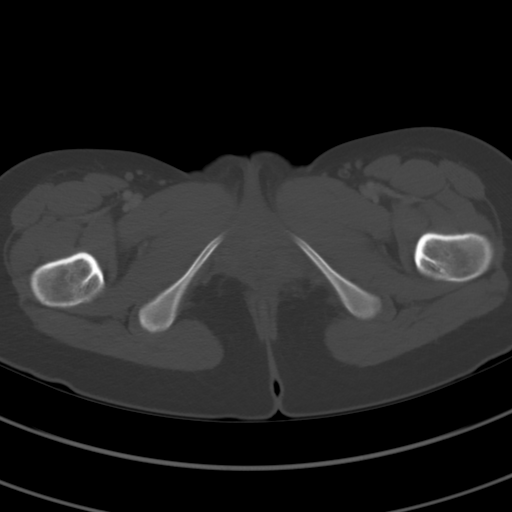
[im 10/76  soft-tissue]
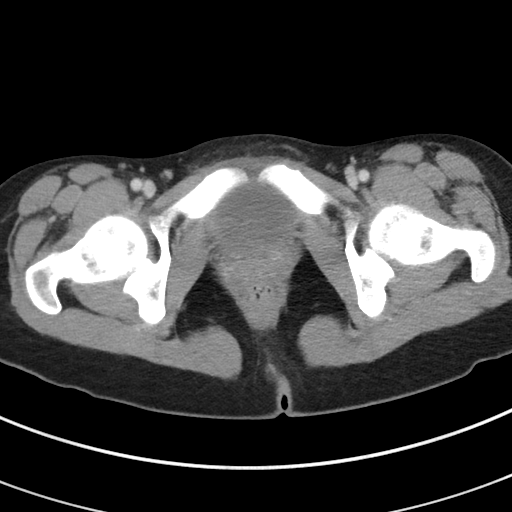
[im 16/76  soft-tissue]
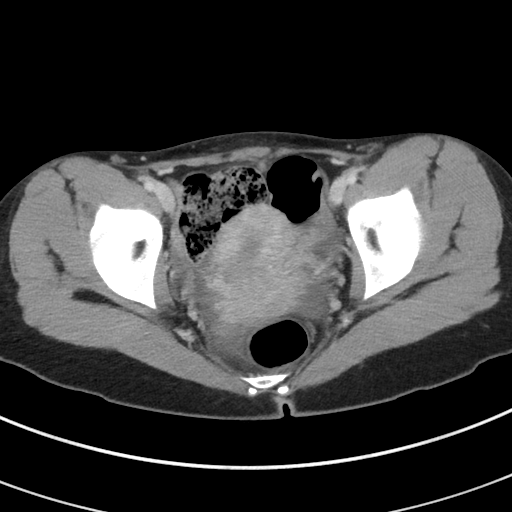
[im 22/76  soft-tissue]
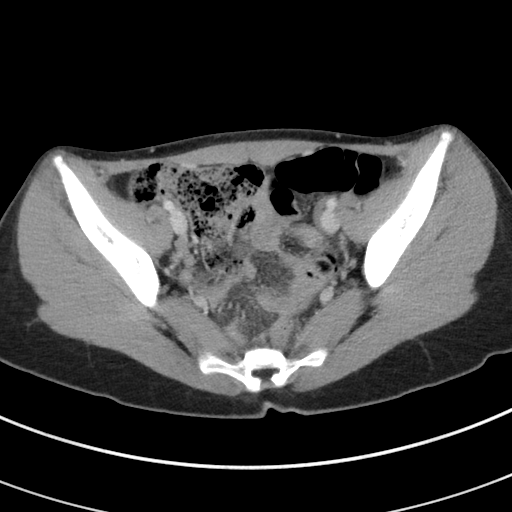
[im 28/76  soft-tissue]
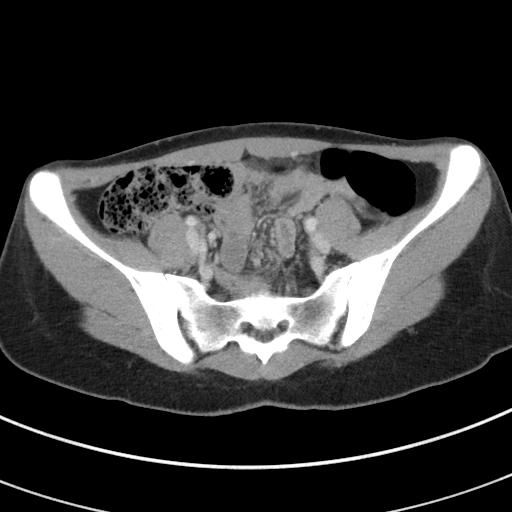
[im 34/76  soft-tissue]
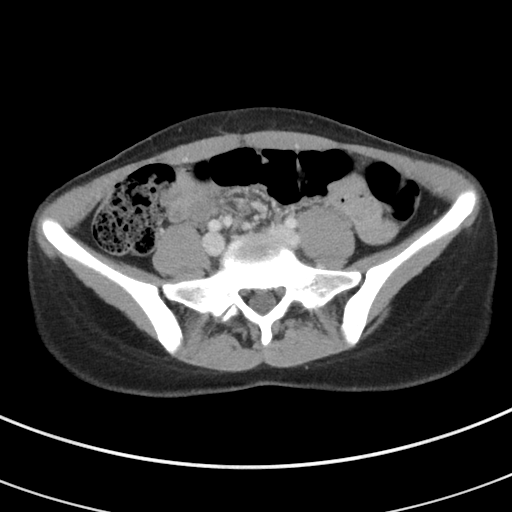
[im 40/76  soft-tissue]
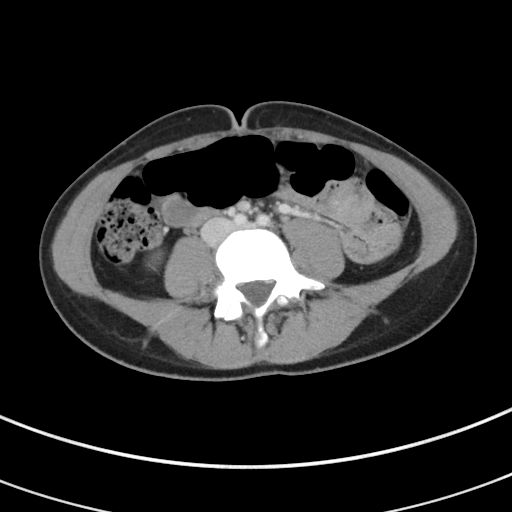
[im 43/76  soft-tissue]
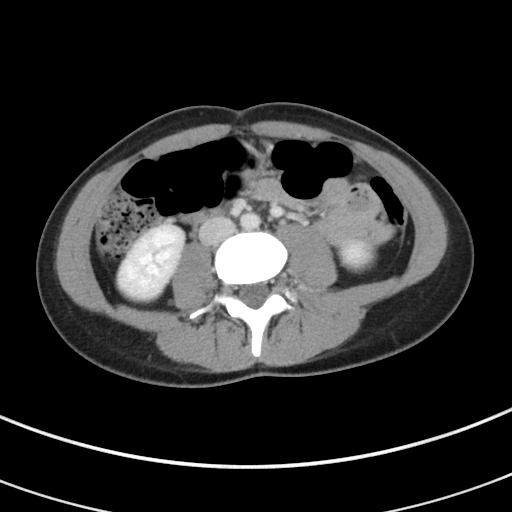
[im 49/76  soft-tissue]
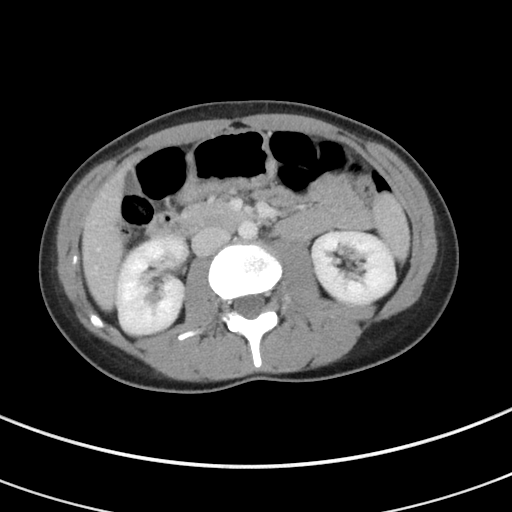
[im 49/76  bone]
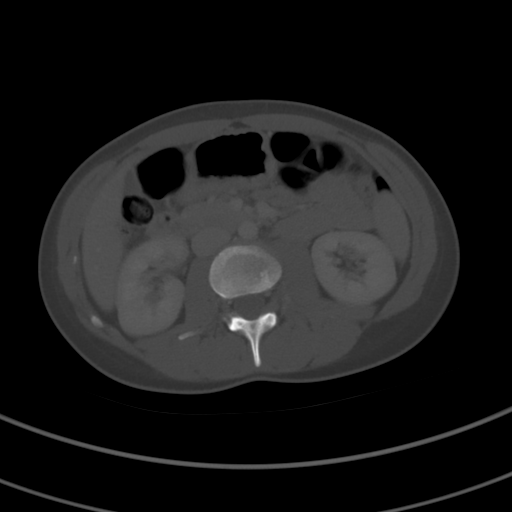
[im 55/76  soft-tissue]
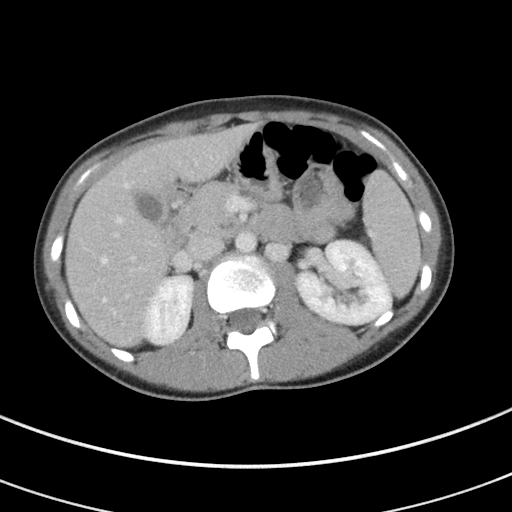
[im 61/76  soft-tissue]
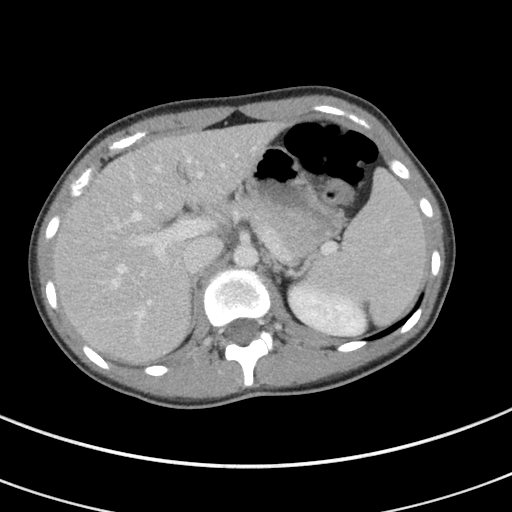
[im 67/76  soft-tissue]
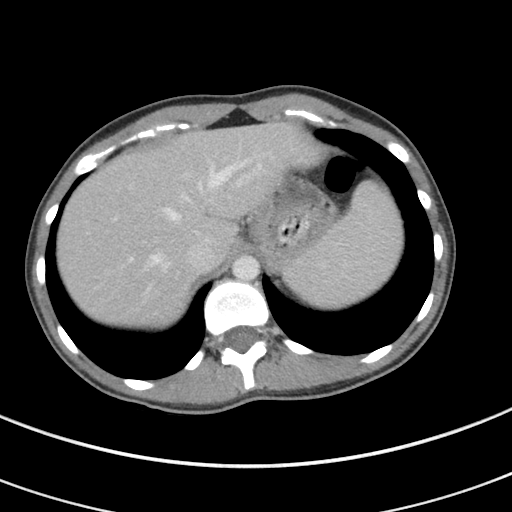
[im 73/76  soft-tissue]
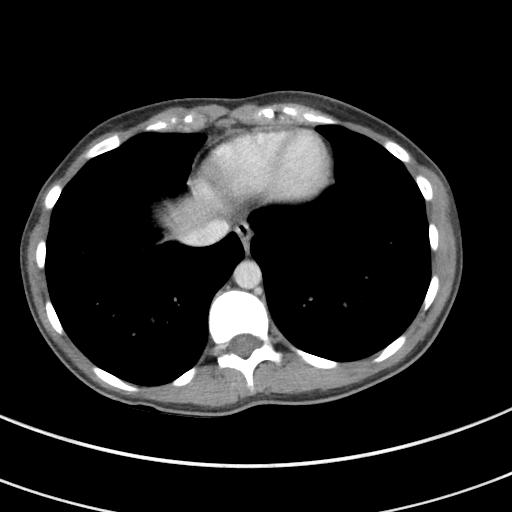

[Series 5: coronal st · coronal · 0.60mm/px · 3 of 66 slices shown]
[im 22/66  soft-tissue]
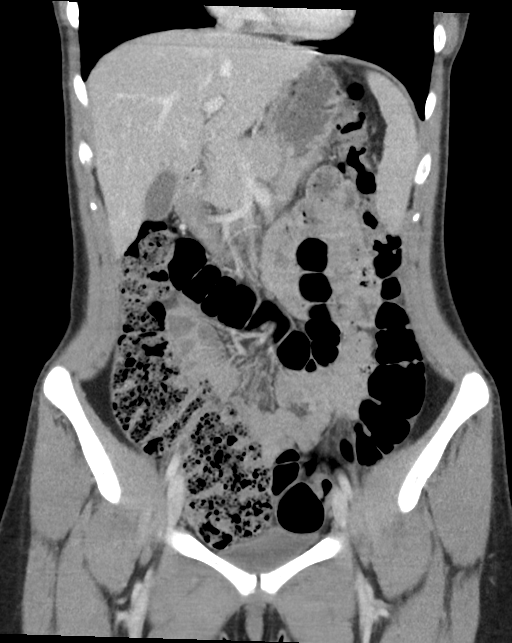
[im 29/66  soft-tissue]
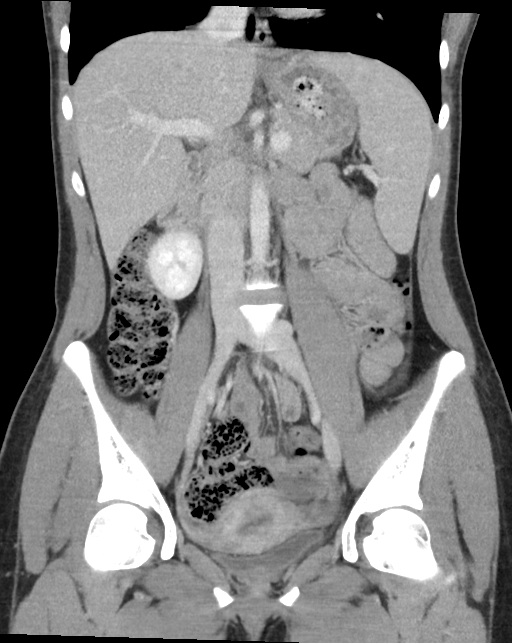
[im 37/66  soft-tissue]
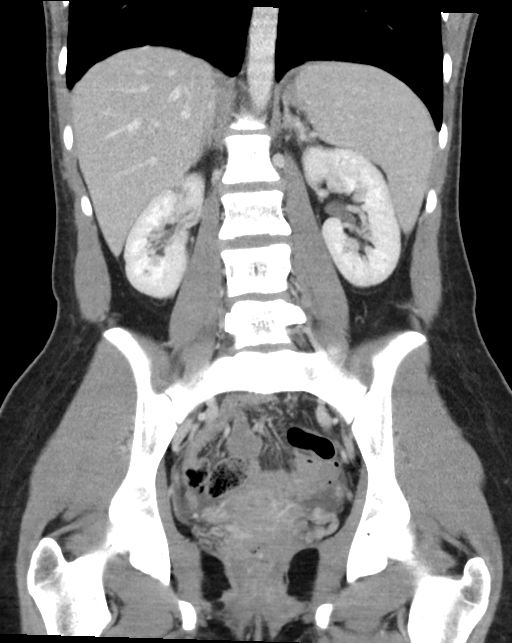

[16 of 46 positions shown; findings below may reference images not displayed]

FINDINGS: Lower chest: Lung bases clear

Hepatobiliary: Gallbladder and liver normal appearance

Pancreas: Normal appearance

Spleen: Normal appearance

Adrenals/Urinary Tract: Adrenal glands, kidneys, ureters, and
bladder normal appearance

Stomach/Bowel: Appendix not definitely visualized. No definite
thickened appendix identified adjacent to the cecal tip. Stomach and
bowel loops otherwise normal appearance.

Vascular/Lymphatic: Unremarkable

Reproductive: Uterus and ovaries grossly normal appearance

Other: Small amount of nonspecific low-attenuation free pelvic
fluid. Minimal stranding of small bowel mesentery in the pelvis,
nonspecific. No free air or abscess. No hernia.

Musculoskeletal: Unremarkable
IMPRESSION: Small amount of nonspecific low-attenuation free pelvic fluid.

Nonvisualization of the appendix limiting assessment for acute
appendicitis though no definite acute pericecal process is
identified.

Remainder of exam unremarkable.
